# Patient Record
Sex: Male | Born: 1985 | Race: White | Hispanic: No | Marital: Single | Smoking: Current every day smoker
Health system: Southern US, Community
[De-identification: ages and names within clinical notes are randomized; demographics above are authoritative.]

## PROBLEM LIST (undated history)

## (undated) DIAGNOSIS — I10 Essential (primary) hypertension: Secondary | ICD-10-CM

## (undated) HISTORY — PX: RENAL BIOPSY: SHX156

---

## 2008-03-01 ENCOUNTER — Emergency Department (HOSPITAL_COMMUNITY): Admission: EM | Admit: 2008-03-01 | Discharge: 2008-03-02 | Payer: Self-pay | Admitting: Emergency Medicine

## 2013-05-22 ENCOUNTER — Emergency Department (HOSPITAL_COMMUNITY): Payer: BC Managed Care – PPO

## 2013-05-22 ENCOUNTER — Encounter (HOSPITAL_COMMUNITY): Payer: Self-pay

## 2013-05-22 ENCOUNTER — Emergency Department (HOSPITAL_COMMUNITY)
Admission: EM | Admit: 2013-05-22 | Discharge: 2013-05-22 | Disposition: A | Discharge status: Home / Self Care | Payer: BC Managed Care – PPO | Attending: Emergency Medicine | Admitting: Emergency Medicine

## 2013-05-22 DIAGNOSIS — N23 Unspecified renal colic: Secondary | ICD-10-CM | POA: Insufficient documentation

## 2013-05-22 DIAGNOSIS — Z79899 Other long term (current) drug therapy: Secondary | ICD-10-CM | POA: Insufficient documentation

## 2013-05-22 DIAGNOSIS — I10 Essential (primary) hypertension: Secondary | ICD-10-CM | POA: Insufficient documentation

## 2013-05-22 DIAGNOSIS — F17200 Nicotine dependence, unspecified, uncomplicated: Secondary | ICD-10-CM | POA: Insufficient documentation

## 2013-05-22 DIAGNOSIS — N201 Calculus of ureter: Secondary | ICD-10-CM | POA: Insufficient documentation

## 2013-05-22 DIAGNOSIS — Z9889 Other specified postprocedural states: Secondary | ICD-10-CM | POA: Insufficient documentation

## 2013-05-22 DIAGNOSIS — R300 Dysuria: Secondary | ICD-10-CM | POA: Insufficient documentation

## 2013-05-22 HISTORY — DX: Essential (primary) hypertension: I10

## 2013-05-22 LAB — URINALYSIS, ROUTINE W REFLEX MICROSCOPIC
Glucose, UA: NEGATIVE mg/dL
Ketones, ur: 15 mg/dL — AB
pH: 5.5 (ref 5.0–8.0)

## 2013-05-22 LAB — URINE MICROSCOPIC-ADD ON

## 2013-05-22 MED ORDER — SODIUM CHLORIDE 0.9 % IV SOLN
Freq: Once | INTRAVENOUS | Status: AC
  Administered 2013-05-22: 15:00:00 via INTRAVENOUS

## 2013-05-22 MED ORDER — KETOROLAC TROMETHAMINE 30 MG/ML IJ SOLN: 30.0000 mg | Freq: Once | INTRAMUSCULAR | Status: DC

## 2013-05-22 NOTE — ED Notes (Signed)
AOZ:[id number]<VH> Expected date:05/22/13<BR> Expected time: 1:14 PM<BR> Means of arrival:<BR> Comments:<BR> Diaphoretic, abd pain

## 2013-05-22 NOTE — ED Provider Notes (Signed)
History     CSN: [id number]  Arrival date & time 05/22/13  1328   First MD Initiated Contact with Patient 05/22/13 1409      Chief Complaint  Patient presents with  . Flank Pain    (Consider location/radiation/quality/duration/timing/severity/associated sxs/prior treatment) Patient is a 27 y.o. male presenting with flank pain. The history is provided by the patient.  Flank Pain  He noted onset yesterday of dark urine and very mild pain in his right flank. As the day went on, his urine started to look like there was blood in it. Flank pain got worse and became severe today. It radiated to to the right mid and lower abdomen. Pain was 10/10 at its worst but has subsided greatly and now it is only 2/10. Of note, he was given fentanyl and ketorolac in the embolus coming to the ED. He denied any nausea or vomiting. He did notice very mild dysuria. He denies fever, chills, sweats. Nothing made his pain better nothing made it worse. He has not had pain like this before.  Past Medical History  Diagnosis Date  . Hypertension     Past Surgical History  Procedure Laterality Date  . Renal biopsy      History reviewed. No pertinent family history.  History  Substance Use Topics  . Smoking status: Current Every Day Smoker -- 1.00 packs/day    Types: Cigarettes  . Smokeless tobacco: Never Used  . Alcohol Use: No      Review of Systems  Genitourinary: Positive for flank pain.  All other systems reviewed and are negative.    Allergies  Sulfa antibiotics  Home Medications   Current Outpatient Rx  Name  Route  Sig  Dispense  Refill  . acetaminophen (TYLENOL) 500 MG tablet   Oral   Take 1,000 mg by mouth every 6 (six) hours as needed for pain.         Marland Kitchen lisdexamfetamine (VYVANSE) 60 MG capsule   Oral   Take 60 mg by mouth every morning.         Marland Kitchen lisinopril-hydrochlorothiazide (PRINZIDE,ZESTORETIC) 10-12.5 MG per tablet   Oral   Take 1 tablet by mouth daily.          . Vilazodone HCl (VIIBRYD) 40 MG TABS   Oral   Take 40 mg by mouth daily.           BP 136/94  Pulse 72  Temp(Src) 98 F (36.7 C) (Oral)  Resp 12  SpO2 99%  Physical Exam  Nursing note and vitals reviewed.  27 year old male, resting comfortably and in no acute distress. Vital signs are significant for mild hypertension with blood pressure 136/94. Oxygen saturation is 99%, which is normal. Head is normocephalic and atraumatic. PERRLA, EOMI. Oropharynx is clear. Neck is nontender and supple without adenopathy or JVD. Back is nontender and there is no CVA tenderness. Lungs are clear without rales, wheezes, or rhonchi. Chest is nontender. Heart has regular rate and rhythm without murmur. Abdomen is soft, flat, nontender without masses or hepatosplenomegaly and peristalsis is normoactive. Extremities have no cyanosis or edema, full range of motion is present. Skin is warm and dry without rash. Neurologic: Mental status is normal, cranial nerves are intact, there are no motor or sensory deficits.  ED Course  Procedures (including critical care time)  Results for orders placed during the hospital encounter of 05/22/13  URINALYSIS, ROUTINE W REFLEX MICROSCOPIC      Result Value Range  Color, Urine RED (*) YELLOW   APPearance CLOUDY (*) CLEAR   Specific Gravity, Urine 1.031 (*) 1.005 - 1.030   pH 5.5  5.0 - 8.0   Glucose, UA NEGATIVE  NEGATIVE mg/dL   Hgb urine dipstick LARGE (*) NEGATIVE   Bilirubin Urine MODERATE (*) NEGATIVE   Ketones, ur 15 (*) NEGATIVE mg/dL   Protein, ur >604 (*) NEGATIVE mg/dL   Urobilinogen, UA 1.0  0.0 - 1.0 mg/dL   Nitrite NEGATIVE  NEGATIVE   Leukocytes, UA SMALL (*) NEGATIVE  URINE MICROSCOPIC-ADD ON      Result Value Range   Squamous Epithelial / LPF RARE  RARE   WBC, UA 3-6  <3 WBC/hpf   RBC / HPF TOO NUMEROUS TO COUNT  <3 RBC/hpf   Bacteria, UA MANY (*) RARE   Crystals CA OXALATE CRYSTALS (*) NEGATIVE   Urine-Other MUCOUS PRESENT      Ct Abdomen Pelvis Wo Contrast  05/22/2013   *RADIOLOGY REPORT*  Clinical Data: Right flank pain  CT ABDOMEN AND PELVIS WITHOUT CONTRAST  Technique:  Multidetector CT imaging of the abdomen and pelvis was performed following the standard protocol without intravenous contrast.  Comparison: None.  Findings: 5 mm calculus at the right ureteral vesicle junction is associated with mild right hydroureter and mild right hydronephrosis.  No evidence of left-sided urinary calculus.  Normal appendix.  The unenhanced liver, gallbladder, spleen, pancreas, adrenal glands are within normal limits.  Unremarkable prostate.  No free fluid.  No obvious abnormal adenopathy.  No acute bony deformity.  Right paracentral disc protrusion at L5- S1.  Shallow central protrusion at L4-5.  Shallow central protrusion at L3-4.  IMPRESSION: 5 mm right ureteral vesicle junction calculus associated with right hydronephrosis.  Lumbar degenerative disc disease.   Original Report Authenticated By: Jolaine Click, M.D.    Images viewed by me.   1. Ureteral colic   2. Right distal ureteral calculus       MDM  Right flank pain suspicious for ureteral colic. Marked improvement of pain could be related to medication that he got in the ambulance and could be related to spontaneous passage of stone. You'll be sent for CT of abdomen and pelvis and urinalysis will be sent.  CT shows a 5 mm calculus at the very distal end of the ureter. Patient continues to be essentially pain free, so I suspect the stone has fallen into the bladder. He is reassured that he should not have significant problems from his stone, but he is to return to pain worsens. He is advised take over-the-counter NSAIDs for the next 3 days.       Dione Booze, MD 05/22/13 [phone]

## 2013-05-22 NOTE — ED Notes (Addendum)
Pt in via EMS c/o R flank pain, radiates to R abd and groin. Pt reports blood in urine. Pt denies hx of kidney stones. Pt given 250 mcg Fentanyl and 30 Toradol en route. Pt has hx of HTN

## 2013-05-22 NOTE — ED Notes (Signed)
Pt escorted to discharge window. Verbalized understanding discharge instructions. In no acute distress. Vitals reviewed and WDL.  

## 2013-05-22 NOTE — ED Notes (Signed)
Pt sts increasing R flank pain radiating to R abdomen and R groin x 1 days.  Sts burning with urination and hematuria.

## 2013-05-23 LAB — URINE CULTURE: Culture: NO GROWTH

## 2014-02-04 IMAGING — CT CT ABD-PELV W/O CM
1 series · 16 of 32 positions shown, 20 images · non-contrast
Comparison: None.

CLINICAL DATA: Right flank pain

CT ABDOMEN AND PELVIS WITHOUT CONTRAST
TECHNIQUE: Multidetector CT imaging of the abdomen and pelvis was
performed following the standard protocol without intravenous
contrast.

[Series 4: lung · axial · 0.74mm/px · z∈[+112,+252]mm · 16 of 32 slices shown, 20 images]
[im 3/32  soft-tissue]
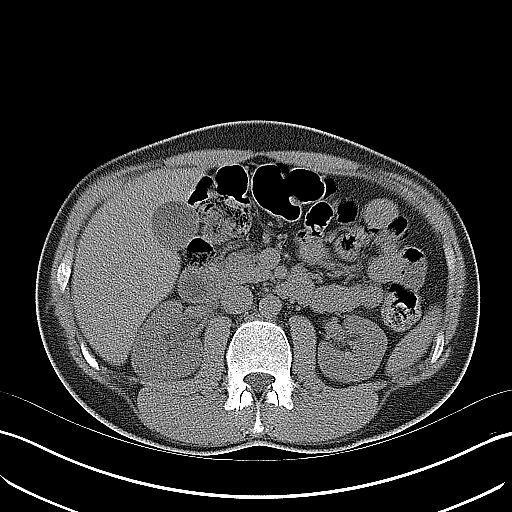
[im 3/32  bone]
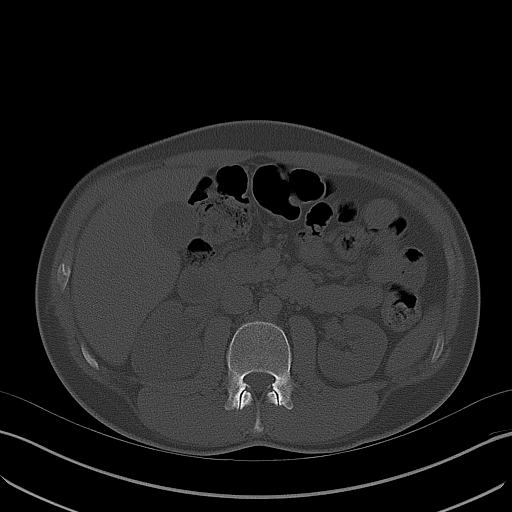
[im 5/32  soft-tissue]
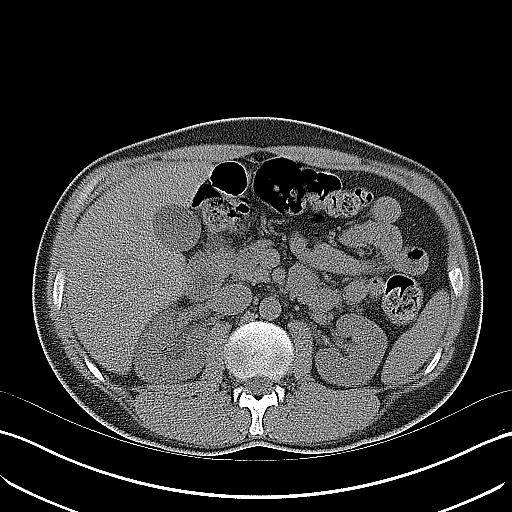
[im 7/32  soft-tissue]
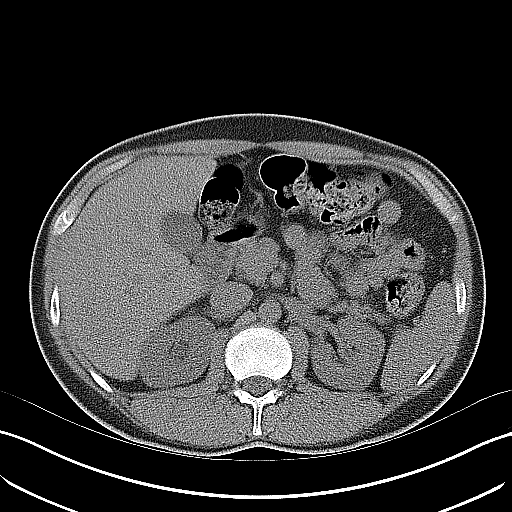
[im 9/32  soft-tissue]
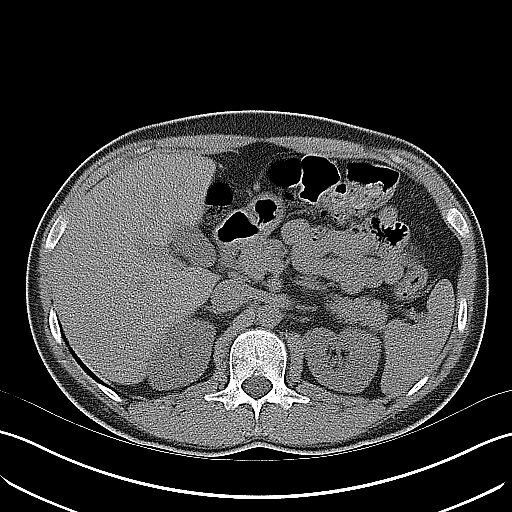
[im 11/32  soft-tissue]
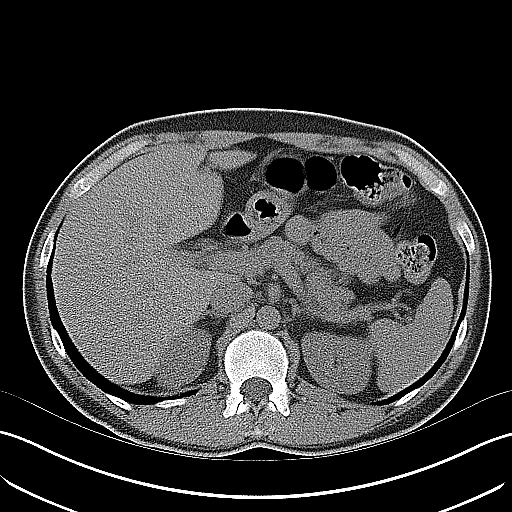
[im 13/32  soft-tissue]
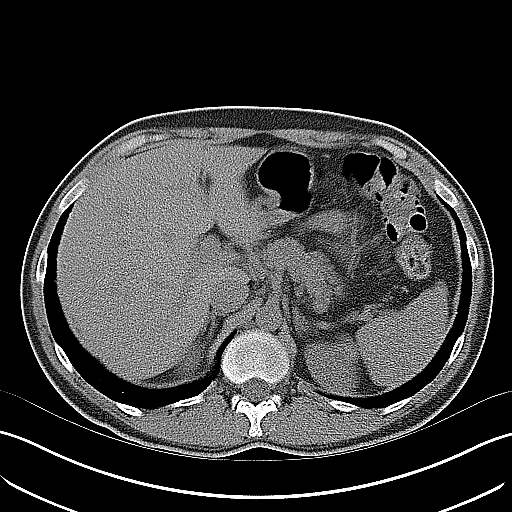
[im 15/32  soft-tissue]
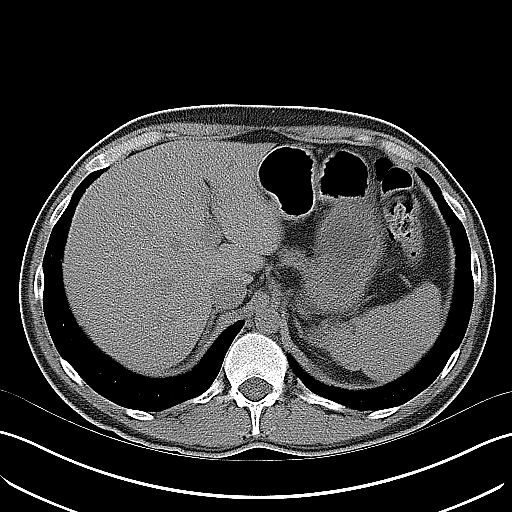
[im 18/32  soft-tissue]
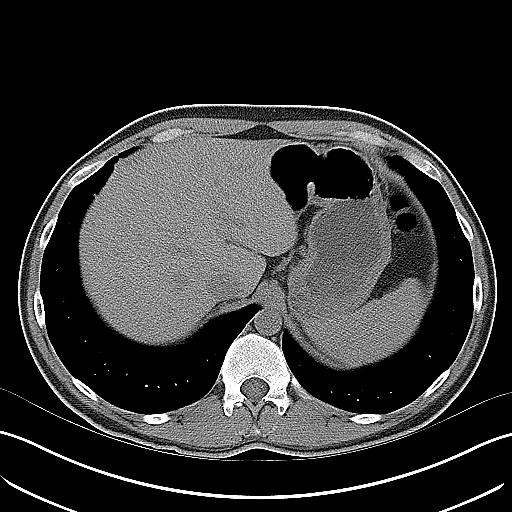
[im 20/32  soft-tissue]
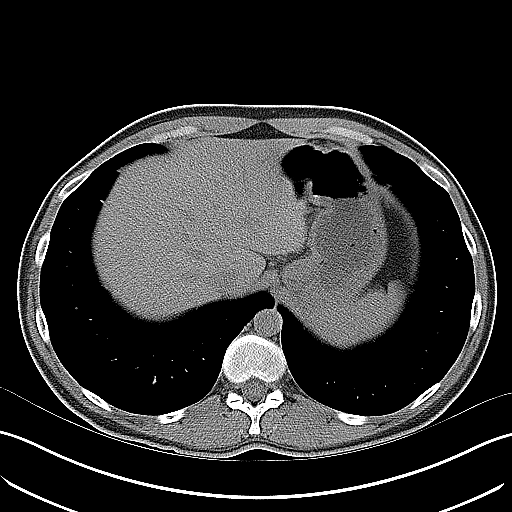
[im 20/32  bone]
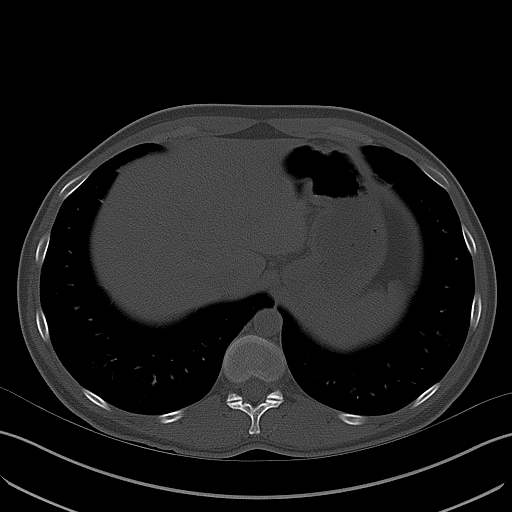
[im 22/32  soft-tissue]
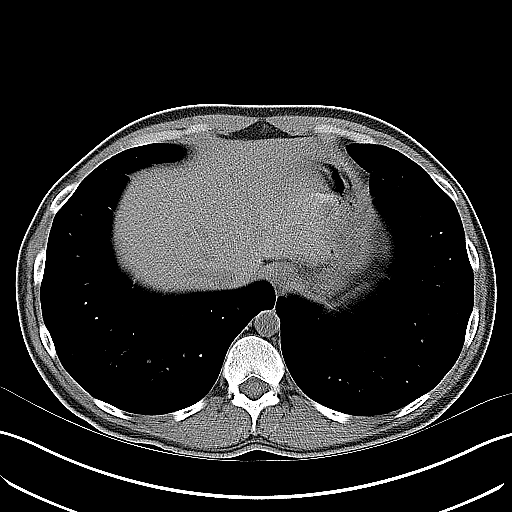
[im 24/32  soft-tissue]
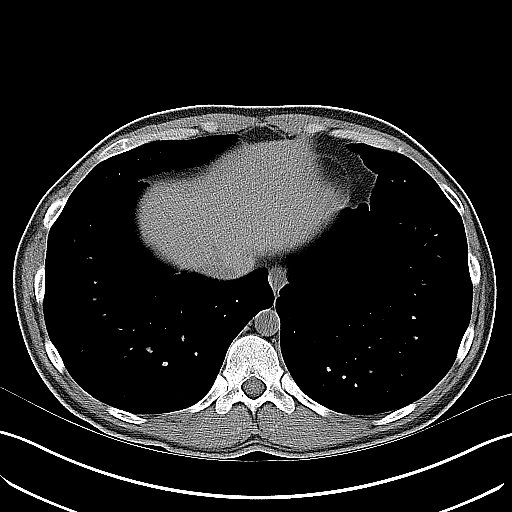
[im 26/32  soft-tissue]
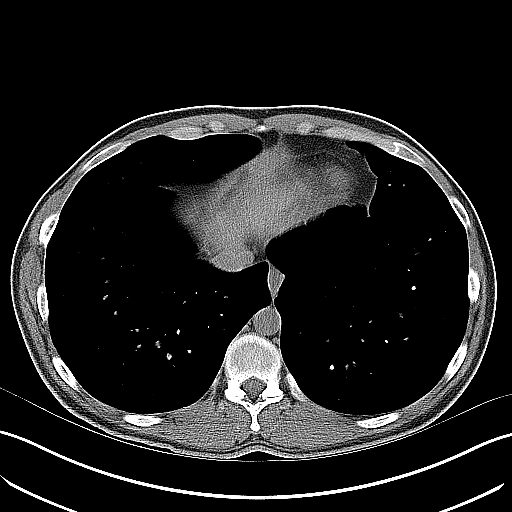
[im 28/32  soft-tissue]
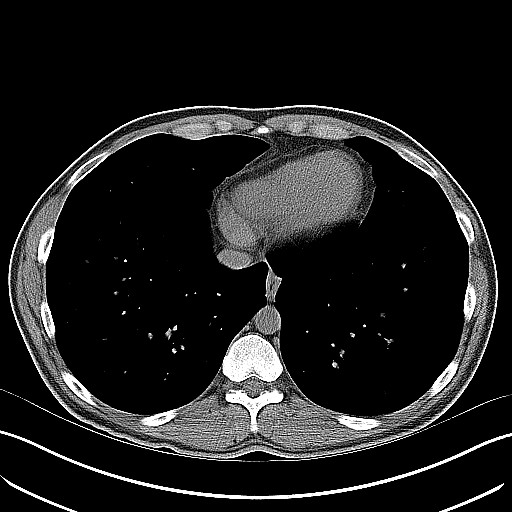
[im 28/32  lung]
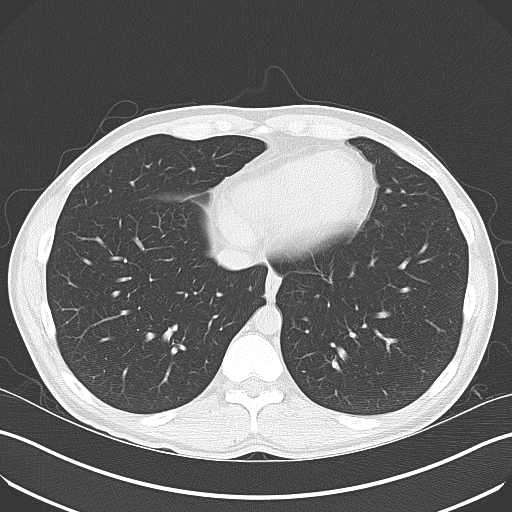
[im 29/32  lung]
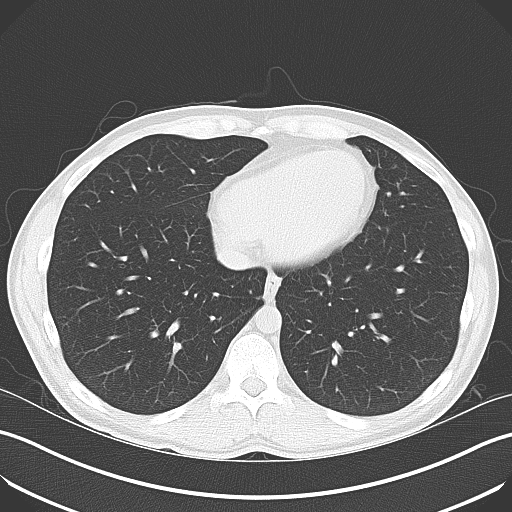
[im 30/32  soft-tissue]
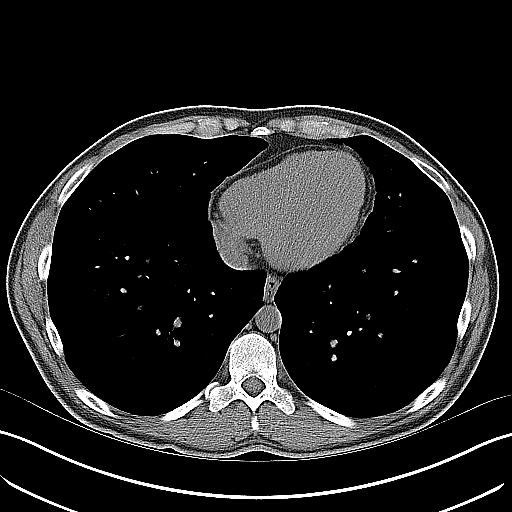
[im 30/32  lung]
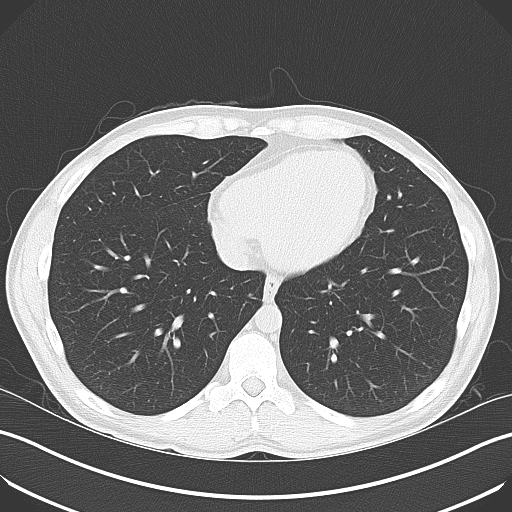
[im 31/32  lung]
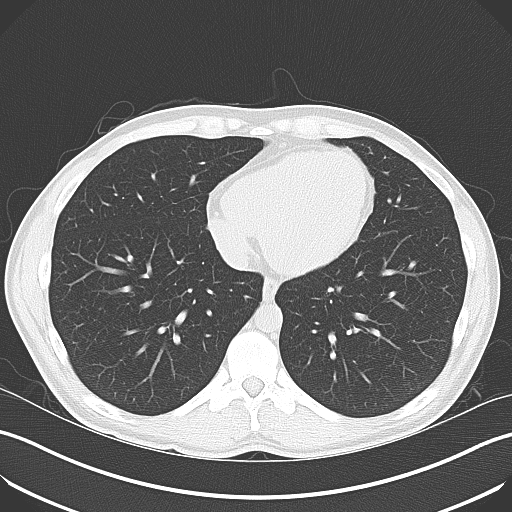

[16 of 32 positions shown; findings below may reference images not displayed]

FINDINGS: 5 mm calculus at the right ureteral vesicle junction is
associated with mild right hydroureter and mild right
hydronephrosis.  No evidence of left-sided urinary calculus.

Normal appendix.

The unenhanced liver, gallbladder, spleen, pancreas, adrenal glands
are within normal limits.

Unremarkable prostate.

No free fluid.  No obvious abnormal adenopathy.

No acute bony deformity.  Right paracentral disc protrusion at L5-
S1.  Shallow central protrusion at L4-5.  Shallow central
protrusion at L3-4.
IMPRESSION: 5 mm right ureteral vesicle junction calculus associated with right
hydronephrosis.

Lumbar degenerative disc disease.

## 2017-10-19 ENCOUNTER — Emergency Department (HOSPITAL_COMMUNITY)
Admission: EM | Admit: 2017-10-19 | Discharge: 2017-10-19 | Disposition: A | Discharge status: Home / Self Care | Payer: BLUE CROSS/BLUE SHIELD | Attending: Emergency Medicine | Admitting: Emergency Medicine

## 2017-10-19 ENCOUNTER — Encounter (HOSPITAL_COMMUNITY): Payer: Self-pay | Admitting: Emergency Medicine

## 2017-10-19 DIAGNOSIS — Y9301 Activity, walking, marching and hiking: Secondary | ICD-10-CM | POA: Diagnosis not present

## 2017-10-19 DIAGNOSIS — Y999 Unspecified external cause status: Secondary | ICD-10-CM | POA: Diagnosis not present

## 2017-10-19 DIAGNOSIS — W01198A Fall on same level from slipping, tripping and stumbling with subsequent striking against other object, initial encounter: Secondary | ICD-10-CM | POA: Insufficient documentation

## 2017-10-19 DIAGNOSIS — S0181XA Laceration without foreign body of other part of head, initial encounter: Secondary | ICD-10-CM | POA: Diagnosis not present

## 2017-10-19 DIAGNOSIS — I10 Essential (primary) hypertension: Secondary | ICD-10-CM | POA: Insufficient documentation

## 2017-10-19 DIAGNOSIS — Y929 Unspecified place or not applicable: Secondary | ICD-10-CM | POA: Diagnosis not present

## 2017-10-19 DIAGNOSIS — Z79899 Other long term (current) drug therapy: Secondary | ICD-10-CM | POA: Diagnosis not present

## 2017-10-19 DIAGNOSIS — F1490 Cocaine use, unspecified, uncomplicated: Secondary | ICD-10-CM | POA: Diagnosis not present

## 2017-10-19 DIAGNOSIS — F17210 Nicotine dependence, cigarettes, uncomplicated: Secondary | ICD-10-CM | POA: Insufficient documentation

## 2017-10-19 DIAGNOSIS — R55 Syncope and collapse: Secondary | ICD-10-CM

## 2017-10-19 LAB — BASIC METABOLIC PANEL
ANION GAP: 9 (ref 5–15)
BUN: 8 mg/dL (ref 6–20)
CALCIUM: 8.6 mg/dL — AB (ref 8.9–10.3)
CO2: 23 mmol/L (ref 22–32)
Chloride: 102 mmol/L (ref 101–111)
Creatinine, Ser: 0.73 mg/dL (ref 0.61–1.24)
GFR calc Af Amer: >60 mL/min (ref >60)
GFR calc non Af Amer: >60 mL/min (ref >60)
GLUCOSE: 106 mg/dL — AB (ref 65–99)
POTASSIUM: 3.5 mmol/L (ref 3.5–5.1)
SODIUM: 134 mmol/L — AB (ref 135–145)

## 2017-10-19 LAB — CBC
HCT: 42.9 % (ref 39.0–52.0)
HEMOGLOBIN: 14 g/dL (ref 13.0–17.0)
MCH: 30 pg (ref 26.0–34.0)
MCHC: 32.6 g/dL (ref 30.0–36.0)
MCV: 91.9 fL (ref 78.0–100.0)
Platelets: 339 10*3/uL (ref 150–400)
RBC: 4.67 MIL/uL (ref 4.22–5.81)
RDW: 13.7 % (ref 11.5–15.5)
WBC: 14.5 10*3/uL — AB (ref 4.0–10.5)

## 2017-10-19 LAB — TROPONIN I: Troponin I: <0.03 ng/mL (ref <0.03)

## 2017-10-19 MED ORDER — LIDOCAINE-EPINEPHRINE (PF) 2 %-1:200000 IJ SOLN: 20.0000 mL | Freq: Once | INTRAMUSCULAR | Status: DC

## 2017-10-19 NOTE — ED Notes (Signed)
ED Provider at bedside. 

## 2017-10-19 NOTE — Discharge Instructions (Signed)
Keep the wound clean and dry. You can use antibiotic ointment on the wound. You can use ice packs for comfort. The sutures need to be removed in 3-5 days. Return to the emergency department if the wound gets infected, or you have any problems listed on the head injury sheet.

## 2017-10-19 NOTE — ED Triage Notes (Signed)
Reports feeling hot and walking outside.  Had a syncopal episode.  Found lying on concrete.  Per witnesses was some confused initially and seem weak for 10-15 minutes.  Lac noted to chin.  No deficits noted.  VAN negative.  Reports left side of face feeling numb at this time.  Also endorses feeling dizzy right before passing out.

## 2017-10-19 NOTE — ED Provider Notes (Signed)
  LACERATION REPAIR Performed by: Garlon HatchetSANDERS, Shayann Garbutt M Authorized by: Garlon HatchetSANDERS, Percell Lamboy M Consent: Verbal consent obtained. Risks and benefits: risks, benefits and alternatives were discussed Consent given by: patient Patient identity confirmed: provided demographic data Prepped and Draped in normal sterile fashion Wound explored  Laceration Location: chin  Laceration Length: 5cm  No Foreign Bodies seen or palpated  Anesthesia: local infiltration  Local anesthetic: lidocaine 1% without epinephrine  Anesthetic total: 5 ml  Irrigation method: syringe Amount of cleaning: standard  Skin closure: 4-0 prolene  Number of sutures: 5  Technique: simple interrupted  Patient tolerance: Patient tolerated the procedure well with no immediate complications.    Garlon HatchetSanders, Jasai Sorg M, PA-C 10/19/17 [id number]    Devoria AlbeKnapp, Iva, MD 10/19/17 [phone]

## 2017-10-19 NOTE — ED Provider Notes (Signed)
[facility] EMERGENCY DEPARTMENT Provider Note   CSN: [id number] Arrival date & time: 10/19/17  0014  Time seen 02:40 AM  History   Chief Complaint Chief Complaint  Patient presents with  . Loss of Consciousness    HPI Timothy Gonzales is a 31 y.o. male.  HPI  She states he had felt fine all day. He does state he was smoking some marijuana which he has done a long time. He states he started to feel hot and dizzy and thought he might feel better if he got up to go outside. He states he had something similar happen about a year ago. He denies nausea, vomiting, or diarrhea. He denies drinking alcohol. His friend states he was very pale for at least an hour after it happened. He was unable to stand up and they had to help him get into the car. He states when he fell he hit his jaw he has a laceration. He states his jaw feels numb in the area but he does not have any pain in his teeth or in his jaw. He states his immunizations are up-to-date.  PCP Richmond Campbell., PA-C   Past Medical History:  Diagnosis Date  . Hypertension     There are no active problems to display for this patient.   Past Surgical History:  Procedure Laterality Date  . RENAL BIOPSY         Home Medications    Prior to Admission medications   Medication Sig Start Date End Date Taking? Authorizing Provider  lisdexamfetamine (VYVANSE) 60 MG capsule Take 60 mg by mouth every morning.   Yes [provider]  lisinopril-hydrochlorothiazide (PRINZIDE,ZESTORETIC) 10-12.5 MG per tablet Take 1 tablet by mouth daily.   Yes [provider]  Vilazodone HCl (VIIBRYD) 40 MG TABS Take 40 mg by mouth daily.   Yes [provider]    Family History No family history on file.  Social History Social History  Substance Use Topics  . Smoking status: Current Every Day Smoker    Packs/day: 1.00    Types: Cigarettes  . Smokeless tobacco: Never Used  . Alcohol use No      Allergies   Sulfa antibiotics   Review of Systems Review of Systems  All other systems reviewed and are negative.    Physical Exam Updated Vital Signs BP 126/81   Pulse 92   Temp 97.6 F (36.4 C) (Oral)   Resp 18   Ht 6\' 3"  (1.905 m)   Wt 89.8 kg (198 lb)   SpO2 99%   BMI 24.75 kg/m   Vital signs normal    Physical Exam  Constitutional: He is oriented to person, place, and time. He appears well-developed and well-nourished.  Non-toxic appearance. He does not appear ill. No distress.  HENT:  Head: Normocephalic.  Right Ear: External ear normal.  Left Ear: External ear normal.  Nose: Nose normal. No mucosal edema or rhinorrhea.  Mouth/Throat: Oropharynx is clear and moist and mucous membranes are normal. No dental abscesses or uvula swelling.  Patient has a laceration on his left chin underneath the mandible. It is in the subcutaneous tissue. He has no pain on range of motion of his jaw open his mouth wide. His teeth appear intact. No pain when he bites down. Has a superficial abrasion lateral to his left eye. Facial bones nontender to palpation.   Eyes: Pupils are equal, round, and reactive to light. Conjunctivae and EOM are normal.  Neck:  Normal range of motion and full passive range of motion without pain. Neck supple.  Cardiovascular: Normal rate, regular rhythm and normal heart sounds.  Exam reveals no gallop and no friction rub.   No murmur heard. Pulmonary/Chest: Effort normal and breath sounds normal. No respiratory distress. He has no wheezes. He has no rhonchi. He has no rales. He exhibits no tenderness and no crepitus.  Abdominal: Normal appearance.  Musculoskeletal: Normal range of motion. He exhibits no edema or tenderness.  Moves all extremities well.   Neurological: He is alert and oriented to person, place, and time. He has normal strength. No cranial nerve deficit.  Skin: Skin is warm, dry and intact. No rash noted. No erythema. No pallor.   Psychiatric: He has a normal mood and affect. His speech is normal and behavior is normal. His mood appears not anxious.  Nursing note and vitals reviewed.    Orthostatic VS for the past 24 hrs:  BP- Lying Pulse- Lying BP- Sitting Pulse- Sitting BP- Standing at 0 minutes Pulse- Standing at 0 minutes  10/19/17 0448 115/66 77 130/79 84 121/69 95       ED Treatments / Results  Labs (all labs ordered are listed, but only abnormal results are displayed) Results for orders placed or performed during the hospital encounter of 10/19/17  CBC  Result Value Ref Range   WBC 14.5 (H) 4.0 - 10.5 K/uL   RBC 4.67 4.22 - 5.81 MIL/uL   Hemoglobin 14.0 13.0 - 17.0 g/dL   HCT 09.842.9 11.939.0 - 14.752.0 %   MCV 91.9 78.0 - 100.0 fL   MCH 30.0 26.0 - 34.0 pg   MCHC 32.6 30.0 - 36.0 g/dL   RDW 82.913.7 56.211.5 - 13.015.5 %   Platelets 339 150 - 400 K/uL  Basic metabolic panel  Result Value Ref Range   Sodium 134 (L) 135 - 145 mmol/L   Potassium 3.5 3.5 - 5.1 mmol/L   Chloride 102 101 - 111 mmol/L   CO2 23 22 - 32 mmol/L   Glucose, Bld 106 (H) 65 - 99 mg/dL   BUN 8 6 - 20 mg/dL   Creatinine, Ser 8.650.73 0.61 - 1.24 mg/dL   Calcium 8.6 (L) 8.9 - 10.3 mg/dL   GFR calc non Af Amer >60 >60 mL/min   GFR calc Af Amer >60 >60 mL/min   Anion gap 9 5 - 15  Troponin I  Result Value Ref Range   Troponin I <0.03 <0.03 ng/mL      EKG  EKG Interpretation  Date/Time:  Sunday October 19 2017 00:25:55 EDT Ventricular Rate:  95 PR Interval:  168 QRS Duration: 98 QT Interval:  376 QTC Calculation: 472 R Axis:   102 Text Interpretation:  Normal sinus rhythm Possible Left atrial enlargement Rightward axis No old tracing to compare Confirmed by Devoria AlbeKnapp, Choya Tornow ([id number]) on 10/19/2017 3:57:05 AM       Radiology No results found.  Procedures Procedures (including critical care time)  Medications Ordered in ED Medications  lidocaine-EPINEPHrine (XYLOCAINE W/EPI) 2 %-1:200000 (PF) injection 20 mL (not administered)      Initial Impression / Assessment and Plan / ED Course  I have reviewed the triage vital signs and the nursing notes.  Pertinent labs & imaging results that were available during my care of the patient were reviewed by me and considered in my medical decision making (see chart for details).     Patient sutured by PA Sanders.  Patient states he's able to stand  he no longer feels dizzy or weak. He feels ready to go home.his orthostatic vital signs are normal.  Final Clinical Impressions(s) / ED Diagnoses   Final diagnoses:  Syncope, unspecified syncope type  Chin laceration, initial encounter    Plan discharge  Devoria Albe, MD, Concha Pyo, MD 10/19/17 [phone]
